# Patient Record
Sex: Male | Born: 1970 | Race: Black or African American | Hispanic: No | Marital: Single | Smoking: Never smoker
Health system: Southern US, Community
[De-identification: ages and names within clinical notes are randomized; demographics above are authoritative.]

## PROBLEM LIST (undated history)

## (undated) DIAGNOSIS — G5621 Lesion of ulnar nerve, right upper limb: Secondary | ICD-10-CM

## (undated) HISTORY — DX: Lesion of ulnar nerve, right upper limb: G56.21

---

## 2005-07-23 ENCOUNTER — Emergency Department (HOSPITAL_COMMUNITY): Admission: EM | Admit: 2005-07-23 | Discharge: 2005-07-23 | Payer: Self-pay | Admitting: Emergency Medicine

## 2006-02-13 ENCOUNTER — Emergency Department (HOSPITAL_COMMUNITY): Admission: EM | Admit: 2006-02-13 | Discharge: 2006-02-13 | Payer: Self-pay | Admitting: Family Medicine

## 2007-05-29 ENCOUNTER — Emergency Department (HOSPITAL_COMMUNITY): Admission: EM | Admit: 2007-05-29 | Discharge: 2007-05-29 | Payer: Self-pay | Admitting: Family Medicine

## 2008-09-03 IMAGING — CR DG FOOT COMPLETE 3+V*L*
3 series · 3 of 3 positions shown · non-contrast
Comparison: None.

CLINICAL DATA: Left foot pain.
 LEFT FOOT ? 3 VIEW:

[view not recorded (1 of 3)]
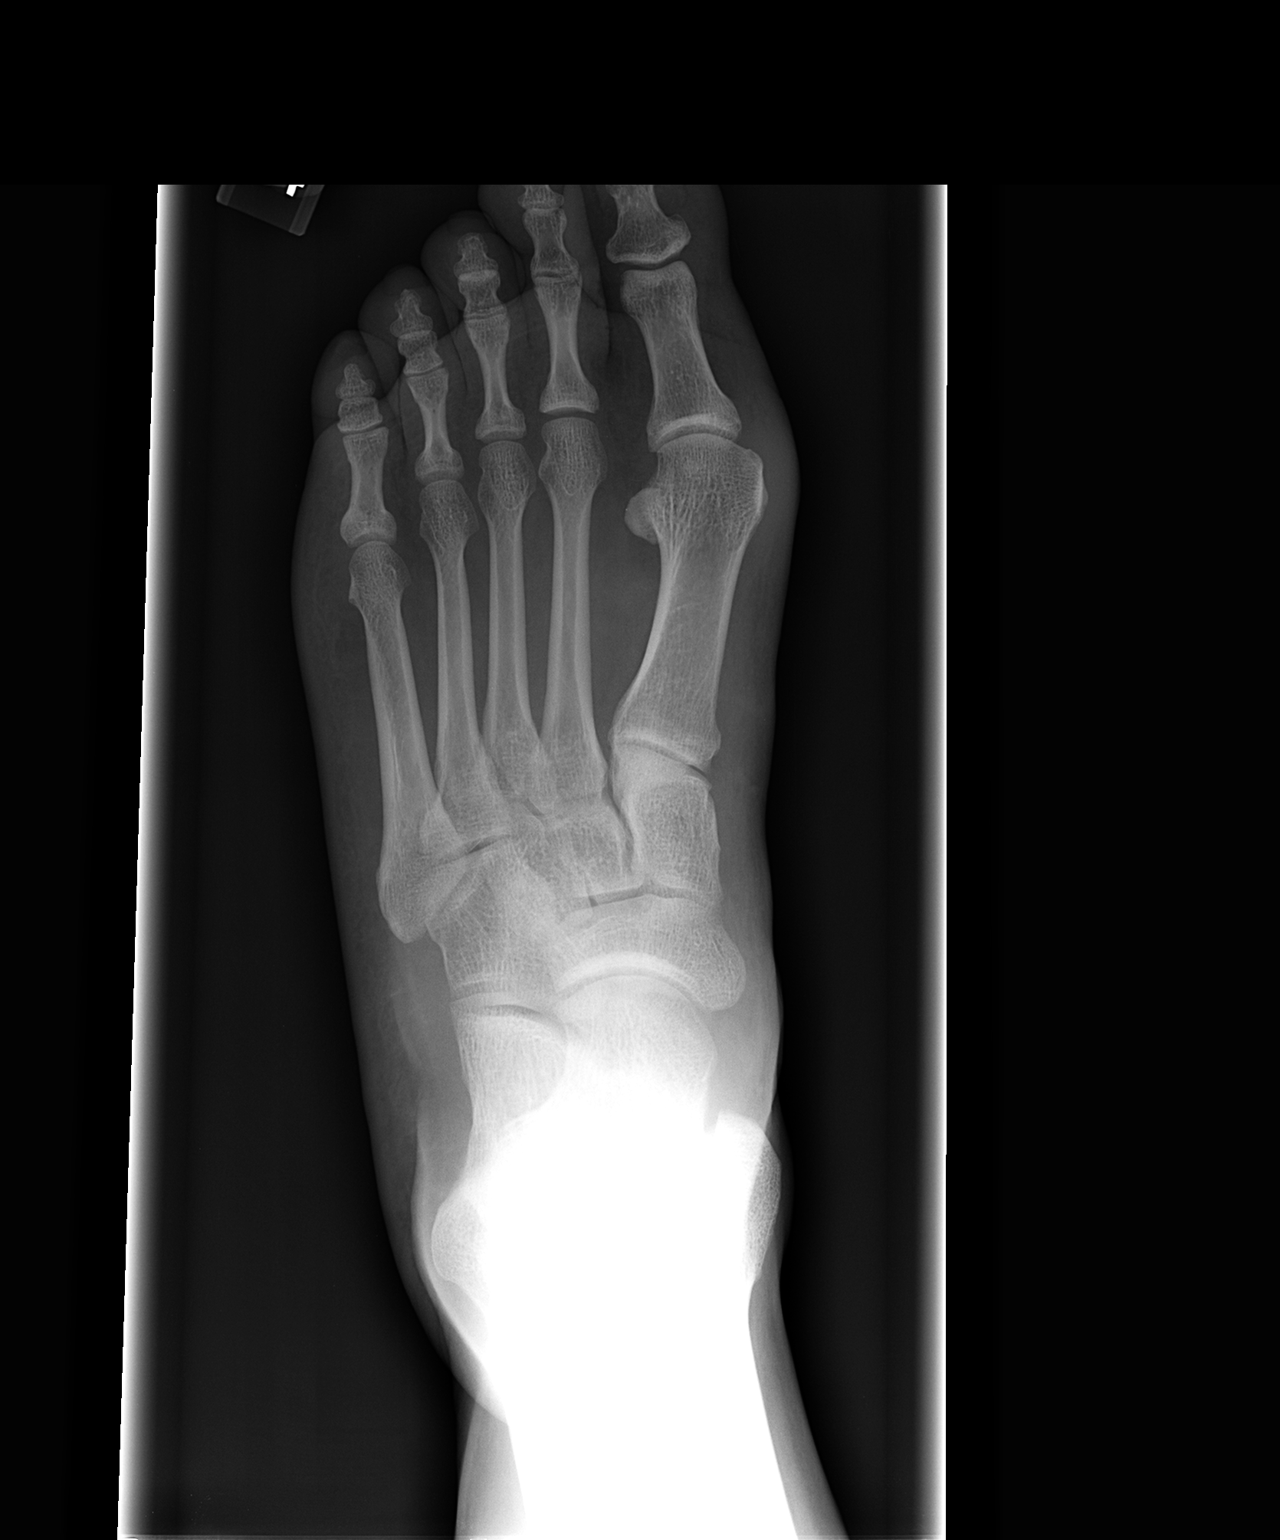

[view not recorded (2 of 3)]
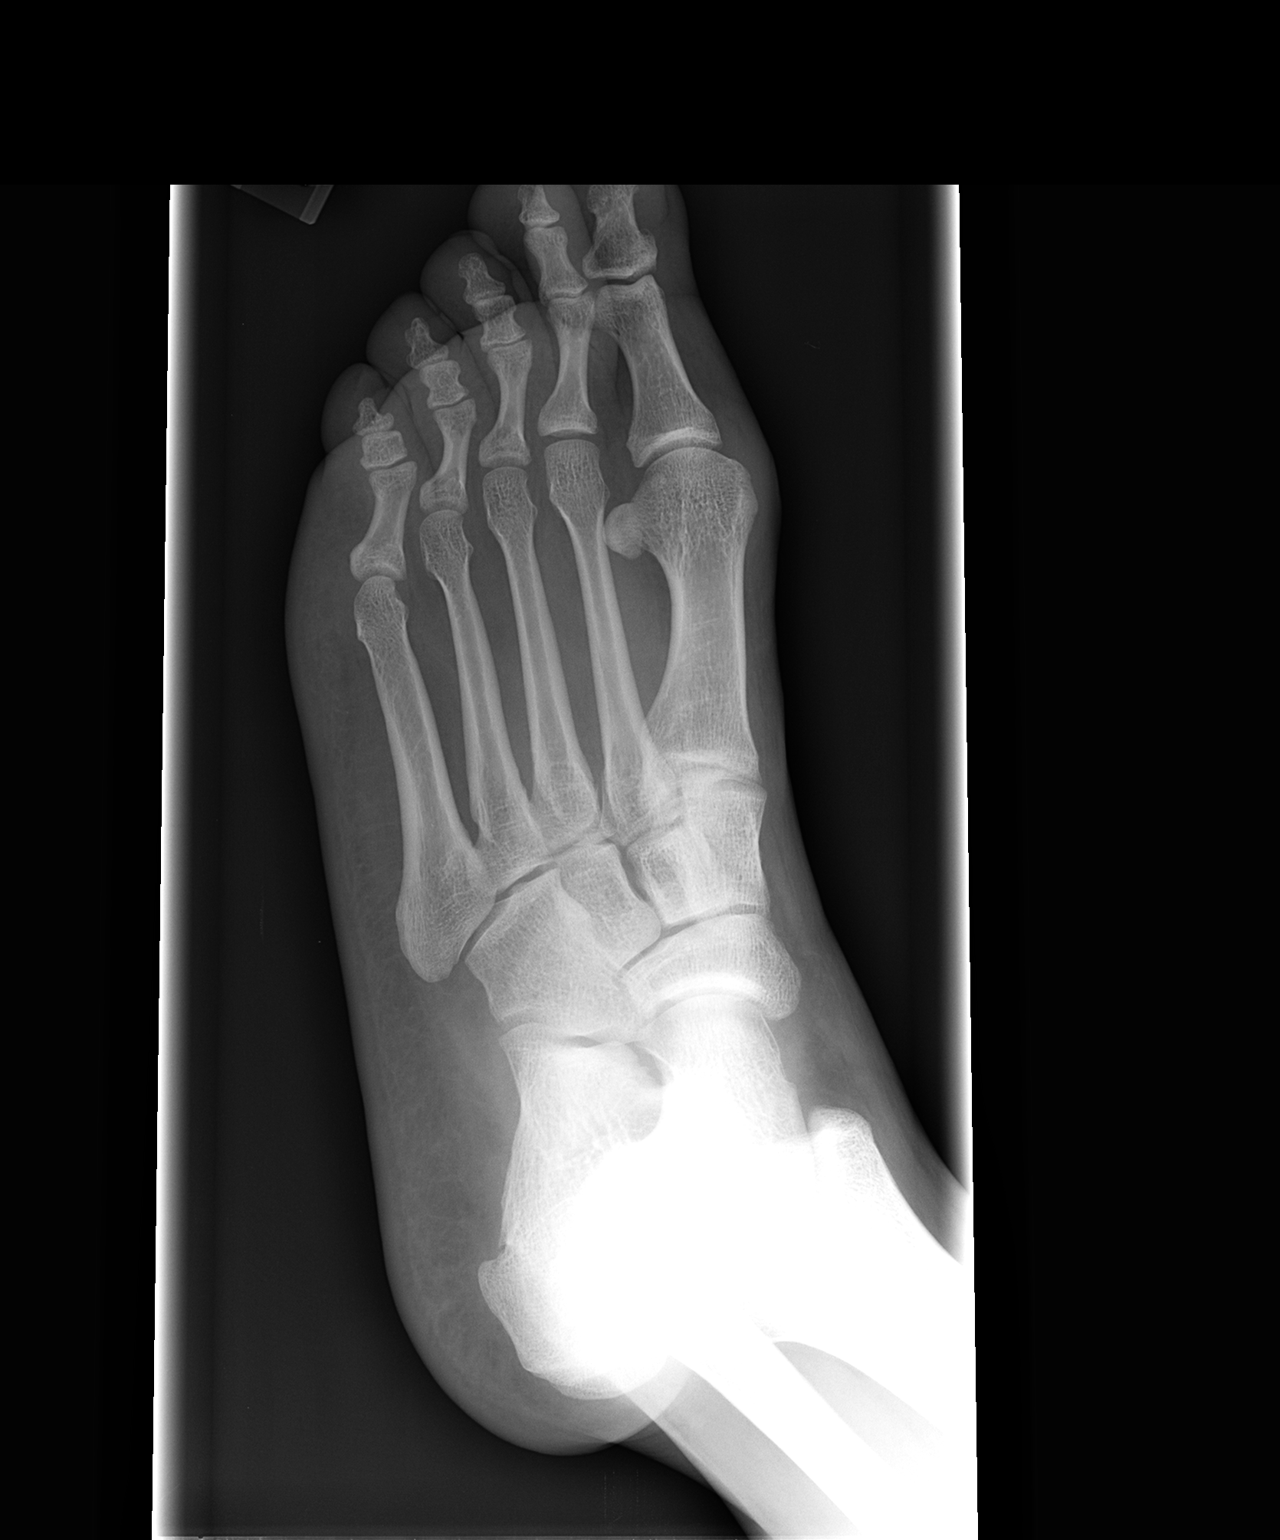

[view not recorded (3 of 3)]
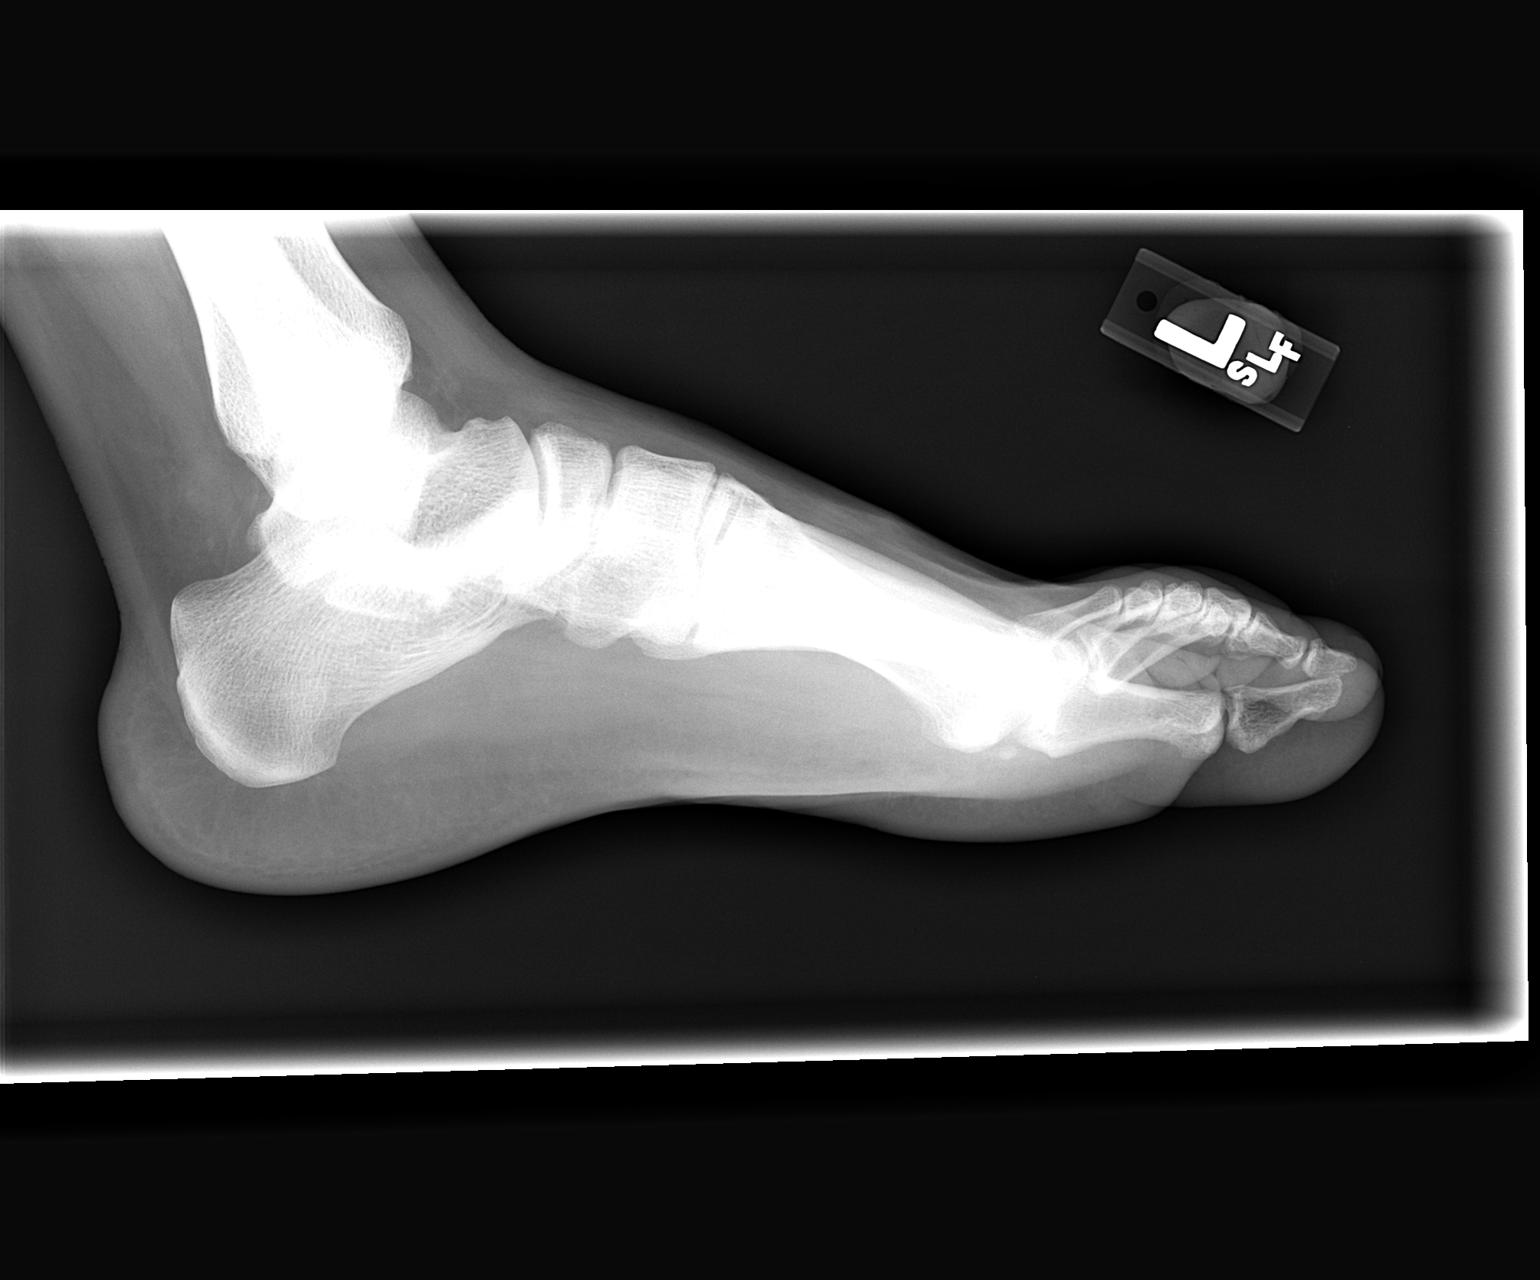

[3 of 3 positions shown; findings below may reference images not displayed]

FINDINGS: No fracture, dislocation or other bony abnormality is seen.  Soft tissues are unremarkable.
IMPRESSION: Normal left foot.

## 2014-11-24 ENCOUNTER — Encounter: Payer: Self-pay | Admitting: Neurology

## 2014-11-24 ENCOUNTER — Ambulatory Visit (INDEPENDENT_AMBULATORY_CARE_PROVIDER_SITE_OTHER): Payer: 59 | Admitting: Neurology

## 2014-11-24 ENCOUNTER — Ambulatory Visit (INDEPENDENT_AMBULATORY_CARE_PROVIDER_SITE_OTHER): Payer: Self-pay | Admitting: Neurology

## 2014-11-24 DIAGNOSIS — G5621 Lesion of ulnar nerve, right upper limb: Secondary | ICD-10-CM

## 2014-11-24 DIAGNOSIS — R202 Paresthesia of skin: Secondary | ICD-10-CM

## 2014-11-24 HISTORY — DX: Lesion of ulnar nerve, right upper limb: G56.21

## 2014-11-24 NOTE — Procedures (Signed)
     HISTORY:  Stanley Burch is a 44 year old gentleman with a one-month history of numbness that has affected primarily the ulnar aspect of the right hand. The patient indicates that the episode came on suddenly and has persisted. The patient denies any neck pain or pain down the right arm. The patient is being evaluated for a possible neuropathy or a cervical radiculopathy.  NERVE CONDUCTION STUDIES:  Nerve conduction studies were performed on the right upper extremity. The distal motor latencies and motor amplitudes for the median and ulnar nerves were within normal limits. The F wave latencies and nerve conduction velocities for these nerves were also normal. The sensory latencies for the median and ulnar nerves were normal.   EMG STUDIES:  EMG study was performed on the right upper extremity:  The first dorsal interosseous muscle reveals 2 to 4 K units with slightly decreased recruitment. 2+ fibrillations and positive waves were noted. The abductor pollicis brevis muscle reveals 2 to 5 K units with decreased recruitment. No fibrillations or positive waves were noted. The extensor indicis proprius muscle reveals 1 to 3 K units with full recruitment. No fibrillations or positive waves were noted. The pronator teres muscle reveals 2 to 3 K units with full recruitment. No fibrillations or positive waves were noted. The flexor digitorum profundus muscle (III-IV) reveals 2 to 4 K units with decreased recruitment. 2+ fibrillations and positive waves were seen. The biceps muscle reveals 1 to 2 K units with full recruitment. No fibrillations or positive waves were noted. The triceps muscle reveals 2 to 4 K units with full recruitment. No fibrillations or positive waves were noted. The anterior deltoid muscle reveals 2 to 3 K units with full recruitment. No fibrillations or positive waves were noted. The cervical paraspinal muscles were tested at 2 levels. No abnormalities of insertional activity  were seen at either level tested. There was poor relaxation.   IMPRESSION:  Nerve conduction studies done on the right upper extremity were unremarkable. No evidence of a neuropathy is seen. 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 No evidence of an overlying cervical radiculopathy is seen.  Stanley Burch. Stanley Ladawn Boullion MD 11/24/2014 11:08 AM  [facility] [address] [place], [place] [id number]  Phone [phone] Fax [phone]

## 2014-11-24 NOTE — Progress Notes (Signed)
Please refer to EMG and nerve conduction study procedure note. 

## 2016-09-17 DIAGNOSIS — M109 Gout, unspecified: Secondary | ICD-10-CM | POA: Diagnosis not present

## 2016-09-17 DIAGNOSIS — H109 Unspecified conjunctivitis: Secondary | ICD-10-CM | POA: Diagnosis not present

## 2016-09-26 DIAGNOSIS — E1165 Type 2 diabetes mellitus with hyperglycemia: Secondary | ICD-10-CM | POA: Diagnosis not present

## 2016-09-26 DIAGNOSIS — E782 Mixed hyperlipidemia: Secondary | ICD-10-CM | POA: Diagnosis not present

## 2016-10-02 DIAGNOSIS — M12842 Other specific arthropathies, not elsewhere classified, left hand: Secondary | ICD-10-CM | POA: Diagnosis not present

## 2016-10-02 DIAGNOSIS — M064 Inflammatory polyarthropathy: Secondary | ICD-10-CM | POA: Diagnosis not present

## 2016-10-08 DIAGNOSIS — M1A0790 Idiopathic chronic gout, unspecified ankle and foot, without tophus (tophi): Secondary | ICD-10-CM | POA: Diagnosis not present

## 2016-10-08 DIAGNOSIS — E782 Mixed hyperlipidemia: Secondary | ICD-10-CM | POA: Diagnosis not present

## 2016-10-08 DIAGNOSIS — E1165 Type 2 diabetes mellitus with hyperglycemia: Secondary | ICD-10-CM | POA: Diagnosis not present

## 2017-04-08 DIAGNOSIS — Z125 Encounter for screening for malignant neoplasm of prostate: Secondary | ICD-10-CM | POA: Diagnosis not present

## 2017-04-08 DIAGNOSIS — E1165 Type 2 diabetes mellitus with hyperglycemia: Secondary | ICD-10-CM | POA: Diagnosis not present

## 2017-04-08 DIAGNOSIS — I10 Essential (primary) hypertension: Secondary | ICD-10-CM | POA: Diagnosis not present

## 2017-04-15 DIAGNOSIS — E782 Mixed hyperlipidemia: Secondary | ICD-10-CM | POA: Diagnosis not present

## 2017-04-15 DIAGNOSIS — E1165 Type 2 diabetes mellitus with hyperglycemia: Secondary | ICD-10-CM | POA: Diagnosis not present

## 2017-04-15 DIAGNOSIS — Z23 Encounter for immunization: Secondary | ICD-10-CM | POA: Diagnosis not present

## 2017-04-15 DIAGNOSIS — Z0000 Encounter for general adult medical examination without abnormal findings: Secondary | ICD-10-CM | POA: Diagnosis not present

## 2017-08-18 DIAGNOSIS — J019 Acute sinusitis, unspecified: Secondary | ICD-10-CM | POA: Diagnosis not present

## 2017-08-27 DIAGNOSIS — E1165 Type 2 diabetes mellitus with hyperglycemia: Secondary | ICD-10-CM | POA: Diagnosis not present

## 2017-08-27 DIAGNOSIS — E782 Mixed hyperlipidemia: Secondary | ICD-10-CM | POA: Diagnosis not present

## 2017-09-15 DIAGNOSIS — E1165 Type 2 diabetes mellitus with hyperglycemia: Secondary | ICD-10-CM | POA: Diagnosis not present

## 2017-09-15 DIAGNOSIS — E782 Mixed hyperlipidemia: Secondary | ICD-10-CM | POA: Diagnosis not present

## 2017-09-15 DIAGNOSIS — M1A0790 Idiopathic chronic gout, unspecified ankle and foot, without tophus (tophi): Secondary | ICD-10-CM | POA: Diagnosis not present

## 2018-01-18 DIAGNOSIS — M10042 Idiopathic gout, left hand: Secondary | ICD-10-CM | POA: Diagnosis not present

## 2018-02-05 DIAGNOSIS — M1A0790 Idiopathic chronic gout, unspecified ankle and foot, without tophus (tophi): Secondary | ICD-10-CM | POA: Diagnosis not present

## 2018-02-05 DIAGNOSIS — E782 Mixed hyperlipidemia: Secondary | ICD-10-CM | POA: Diagnosis not present

## 2018-02-05 DIAGNOSIS — E1165 Type 2 diabetes mellitus with hyperglycemia: Secondary | ICD-10-CM | POA: Diagnosis not present

## 2018-02-22 DIAGNOSIS — E1165 Type 2 diabetes mellitus with hyperglycemia: Secondary | ICD-10-CM | POA: Diagnosis not present

## 2018-02-22 DIAGNOSIS — E782 Mixed hyperlipidemia: Secondary | ICD-10-CM | POA: Diagnosis not present

## 2018-02-22 DIAGNOSIS — I10 Essential (primary) hypertension: Secondary | ICD-10-CM | POA: Diagnosis not present

## 2018-06-15 DIAGNOSIS — E1165 Type 2 diabetes mellitus with hyperglycemia: Secondary | ICD-10-CM | POA: Diagnosis not present

## 2018-06-15 DIAGNOSIS — Z23 Encounter for immunization: Secondary | ICD-10-CM | POA: Diagnosis not present

## 2018-06-15 DIAGNOSIS — M1A09X0 Idiopathic chronic gout, multiple sites, without tophus (tophi): Secondary | ICD-10-CM | POA: Diagnosis not present

## 2018-06-21 DIAGNOSIS — Z0000 Encounter for general adult medical examination without abnormal findings: Secondary | ICD-10-CM | POA: Diagnosis not present

## 2018-06-21 DIAGNOSIS — E1165 Type 2 diabetes mellitus with hyperglycemia: Secondary | ICD-10-CM | POA: Diagnosis not present

## 2018-06-21 DIAGNOSIS — E782 Mixed hyperlipidemia: Secondary | ICD-10-CM | POA: Diagnosis not present

## 2018-06-21 DIAGNOSIS — I10 Essential (primary) hypertension: Secondary | ICD-10-CM | POA: Diagnosis not present

## 2018-09-28 DIAGNOSIS — I10 Essential (primary) hypertension: Secondary | ICD-10-CM | POA: Diagnosis not present

## 2018-09-28 DIAGNOSIS — E1165 Type 2 diabetes mellitus with hyperglycemia: Secondary | ICD-10-CM | POA: Diagnosis not present

## 2018-09-29 DIAGNOSIS — E1165 Type 2 diabetes mellitus with hyperglycemia: Secondary | ICD-10-CM | POA: Diagnosis not present

## 2018-09-29 DIAGNOSIS — E782 Mixed hyperlipidemia: Secondary | ICD-10-CM | POA: Diagnosis not present

## 2018-09-29 DIAGNOSIS — I10 Essential (primary) hypertension: Secondary | ICD-10-CM | POA: Diagnosis not present

## 2018-09-29 DIAGNOSIS — E291 Testicular hypofunction: Secondary | ICD-10-CM | POA: Diagnosis not present

## 2019-03-24 ENCOUNTER — Other Ambulatory Visit: Payer: Self-pay

## 2019-03-24 DIAGNOSIS — Z20822 Contact with and (suspected) exposure to covid-19: Secondary | ICD-10-CM

## 2019-03-25 LAB — NOVEL CORONAVIRUS, NAA: SARS-CoV-2, NAA: NOT DETECTED

## 2019-05-18 ENCOUNTER — Other Ambulatory Visit: Payer: Self-pay

## 2019-05-18 DIAGNOSIS — Z20822 Contact with and (suspected) exposure to covid-19: Secondary | ICD-10-CM

## 2019-05-19 ENCOUNTER — Telehealth: Payer: Self-pay

## 2019-05-19 LAB — NOVEL CORONAVIRUS, NAA: SARS-CoV-2, NAA: NOT DETECTED

## 2019-05-19 NOTE — Telephone Encounter (Signed)
Patient called in requesting COVID19 lab results and MyChart password reset - DOB/Address verified - Negative results given, reset MyChart password, no further questions. 

## 2021-07-24 ENCOUNTER — Other Ambulatory Visit: Payer: Self-pay | Admitting: Orthopedic Surgery

## 2021-07-24 ENCOUNTER — Other Ambulatory Visit (HOSPITAL_COMMUNITY): Payer: Self-pay | Admitting: Orthopedic Surgery

## 2021-07-24 DIAGNOSIS — M545 Low back pain, unspecified: Secondary | ICD-10-CM

## 2021-08-02 ENCOUNTER — Encounter (HOSPITAL_COMMUNITY): Payer: Self-pay

## 2021-08-02 ENCOUNTER — Ambulatory Visit (HOSPITAL_COMMUNITY): Payer: 59

## 2023-12-01 ENCOUNTER — Ambulatory Visit: Admitting: Podiatry

## 2023-12-17 ENCOUNTER — Encounter: Payer: Self-pay | Admitting: Podiatry

## 2023-12-17 ENCOUNTER — Ambulatory Visit: Admitting: Podiatry

## 2023-12-17 DIAGNOSIS — M722 Plantar fascial fibromatosis: Secondary | ICD-10-CM

## 2023-12-17 MED ORDER — TRIAMCINOLONE ACETONIDE 10 MG/ML IJ SUSP
10.0000 mg | Freq: Once | INTRAMUSCULAR | Status: AC
  Administered 2023-12-17: 10 mg

## 2023-12-17 NOTE — Progress Notes (Signed)
 Patient presents with complaint of pain in the plantar and posterior aspect of the heel right.  Does not anymore for the past several weeks.  Does not recall any specific injury or trauma to it.  Has not noticed any redness or swelling.   Physical exam:  General appearance: Pleasant, and in no acute distress. AOx3.  Vascular: Pedal pulses: DP 2/4, PT 2/4.  No edema lower legs bilaterally. Capillary fill time immediate.  Neurological: Light touch intact feet bilaterally.  Normal Achilles reflex bilaterally.  No clonus or spasticity noted.  Negative Tinel's sign tarsal tunnel, porta pedis, sural nerve right.  Dermatologic:   Skin normal temperature bilaterally.  Skin normal color, tone, and texture bilaterally.   Musculoskeletal: Tenderness at plantar aspect calcaneus at the medial calcaneal tubercle and of the plantar aspect of the heel.  Tenderness at the posterior aspect of the heel and the distal 2 to 3 cm of the Achilles tendon right.  No tenderness with lateral compression of calcaneus.  Radiographs: None today  Diagnosis: 1.  Plantar fasciitis right.  Plan: -Discussed in the plantar fasciitis and Achilles tendinitis on the right foot.  Plantar fasciitis is worse today and this might be aggravating the chronic Achilles tendinitis.  Recommended RICE -injected 3cc 2:1 mixture 0.5 cc Marcaine:Kenolog 10mg /52ml at the origin plantar fascia of right.    Return 1 week follow-up injection right.

## 2023-12-31 ENCOUNTER — Ambulatory Visit: Admitting: Podiatry

## 2024-01-07 ENCOUNTER — Ambulatory Visit: Admitting: Podiatry

## 2024-01-07 ENCOUNTER — Encounter: Payer: Self-pay | Admitting: Podiatry

## 2024-01-07 DIAGNOSIS — M722 Plantar fascial fibromatosis: Secondary | ICD-10-CM

## 2024-01-07 MED ORDER — TRAMADOL HCL 50 MG PO TABS: 50.0000 mg | ORAL_TABLET | Freq: Three times a day (TID) | ORAL | 0 refills | Status: DC | PRN

## 2024-01-07 MED ORDER — TRAMADOL HCL 50 MG PO TABS: 50.0000 mg | ORAL_TABLET | Freq: Three times a day (TID) | ORAL | 0 refills | Status: AC | PRN

## 2024-01-07 MED ORDER — TRIAMCINOLONE ACETONIDE 10 MG/ML IJ SUSP
10.0000 mg | Freq: Once | INTRAMUSCULAR | Status: AC
  Administered 2024-01-07: 10 mg

## 2024-01-07 NOTE — Addendum Note (Signed)
 Addended by: CHRISTINE NORLEEN LABOR on: 01/07/2024 05:31 PM   Modules accepted: Orders

## 2024-01-07 NOTE — Addendum Note (Signed)
 Addended by: GERRIT ANDREZ CROME on: 01/07/2024 05:00 PM   Modules accepted: Orders

## 2024-01-07 NOTE — Addendum Note (Signed)
 Addended by: GERRIT ANDREZ CROME on: 01/07/2024 04:50 PM   Modules accepted: Orders

## 2024-01-07 NOTE — Addendum Note (Signed)
 Addended by: GERRIT ANDREZ CROME on: 01/07/2024 05:19 PM   Modules accepted: Orders

## 2024-01-07 NOTE — Progress Notes (Signed)
 Presents follow-up plantar fasciitis doing better about 50% still some soreness.   Physical exam:  General appearance: Pleasant, and in no acute distress. AOx3.  Vascular: Pedal pulses: DP 2/4 bilaterally, PT 2/4 bilaterally. mild edema lower legs bilaterally. Capillary fill time immediate.  Neurological: Light touch intact feet bilaterally.  Normal Achilles reflex bilaterally.    Dermatologic:   Skin normal temperature bilaterally.  Skin normal color, tone, and texture bilaterally.   Musculoskeletal: Plantar medial aspect heel right at the plantar calcaneal tubercle.  No tenderness with lateral compression of the calcaneus   Diagnosis: 1.  Plantar fasciitis right  Plan: -Some improvement in the plantar fasciitis but.  Will try another injection today. - Continue ice and Voltaren gel as needed Rx naproxen 500 mg, 1 p.o. twice daily, 2 refills -injected 3cc 2:1 mixture 0.5 cc Marcaine:Kenolog 10mg /20ml at origin medial calcaneus plantar fascia right.    Return 2 weeks follow-up plantar fasciitis right

## 2024-01-21 ENCOUNTER — Ambulatory Visit: Admitting: Podiatry

## 2024-02-11 ENCOUNTER — Ambulatory Visit: Admitting: Podiatry

## 2024-03-09 ENCOUNTER — Ambulatory Visit: Admitting: Podiatry

## 2024-03-09 ENCOUNTER — Encounter: Payer: Self-pay | Admitting: Podiatry

## 2024-03-09 DIAGNOSIS — M722 Plantar fascial fibromatosis: Secondary | ICD-10-CM | POA: Diagnosis not present

## 2024-03-09 MED ORDER — TRIAMCINOLONE ACETONIDE 10 MG/ML IJ SUSP
10.0000 mg | Freq: Once | INTRAMUSCULAR | Status: AC
  Administered 2024-03-09: 10 mg

## 2024-03-09 NOTE — Progress Notes (Signed)
 Presents complaining of increased pain over the past couple weeks at the heel on the right.  We have not seen him in 2 months he had been doing better.  Also has started getting pain in the posterior aspect of the heel and along the Achilles tendon right.  Does not recall any injury to it.  Has not noticed any redness or ecchymosis.   Physical exam:  General appearance: Pleasant, and in no acute distress. AOx3.  Vascular: Pedal pulses: DP 2/4 bilaterally, PT 2/4 bilaterally. No edema lower legs bilaterally. Capillary fill time immediate B/L.  Neurological: Negative Tinel's sign tarsal tunnel and porta pedis right   Dermatologic:   Skin normal temperature bilaterally.  Skin normal color, tone, and texture bilaterally.   Musculoskeletal: Tenderness plantar medial heel right.  Tenderness of the medial calcaneal tubercle.  No tenderness over the lateral compression of the calcaneus.  Some tenderness in the posterior aspect of the heel.   Diagnosis: 1.  Plantar fasciitis right  Plan: -Recommend stretching exercises and muscle strengthening exercises for the right. -injected 3cc 2:1 mixture 0.5 cc Marcaine:Kenolog 10mg /55ml at medial plantar fascial origin at the medial plantar calcaneal tubercle right.    Return 2 weeks follow-up injection plantar fascia right

## 2024-03-10 ENCOUNTER — Telehealth: Payer: Self-pay | Admitting: Podiatry

## 2024-03-10 NOTE — Telephone Encounter (Signed)
 He was seen 03/09/2024.

## 2024-03-10 NOTE — Telephone Encounter (Signed)
 Pt. Needs medication refill for Tramadol  50 mg

## 2024-03-10 NOTE — Telephone Encounter (Signed)
 He was just seen in our office.

## 2024-03-17 ENCOUNTER — Other Ambulatory Visit: Payer: Self-pay | Admitting: Podiatry

## 2024-03-17 ENCOUNTER — Telehealth: Payer: Self-pay | Admitting: Lab

## 2024-03-17 DIAGNOSIS — M79673 Pain in unspecified foot: Secondary | ICD-10-CM

## 2024-03-17 MED ORDER — TRAMADOL HCL 50 MG PO TABS: 50.0000 mg | ORAL_TABLET | Freq: Three times a day (TID) | ORAL | 0 refills | Status: AC | PRN

## 2024-03-17 NOTE — Telephone Encounter (Signed)
 Patient is requesting pain medication called in for him he states he called last week and has not gotten a response.

## 2024-03-17 NOTE — Telephone Encounter (Signed)
 Patient states he has been an ongoing patient with you but the pain medication was given in this office has had a few visits in regards to his foot pain that you have been treating him for also he wants to remind you of discussed procedure at his last visit wants to know if you have looked into it.

## 2024-03-17 NOTE — Telephone Encounter (Signed)
Thanks Carolee Rota!!

## 2024-03-17 NOTE — Telephone Encounter (Signed)
 Patient states you prescribed his Tramadol .

## 2024-03-23 ENCOUNTER — Ambulatory Visit: Admitting: Podiatry

## 2024-03-23 DIAGNOSIS — M722 Plantar fascial fibromatosis: Secondary | ICD-10-CM

## 2024-03-23 MED ORDER — TRIAMCINOLONE ACETONIDE 10 MG/ML IJ SUSP
10.0000 mg | Freq: Once | INTRAMUSCULAR | Status: AC
  Administered 2024-03-23: 10 mg

## 2024-03-23 NOTE — Progress Notes (Signed)
 Presents today follow-up injection around plantar fascia right.  About 50% better compared to last visit.  Still sore.   Physical exam:  General appearance: Pleasant, and in no acute distress. AOx3.  Vascular: Pedal pulses: DP 2/4 bilaterally, PT 2/4 bilaterally. No edema lower legs bilaterally. Capillary fill time immediate b/l.  Neurological: Negative Tinel sign tarsal tunnel and porta pedis bilaterally  Dermatologic:   Skin normal temperature bilaterally.  Skin normal color, tone, and texture bilaterally.   Musculoskeletal: Tenderness plantar medial aspect right heel at medial calcaneal tubercle.  No tenderness with lateral compression of the calcaneus.  No tenderness on posterior heel today.   Diagnosis: 1.  Plantar fasciitis right.  Plan: -Continue exercises and wearing beast shoes.  Discussed them again considering a PRP. -injected 3cc 2:1 mixture 0.5 cc Marcaine:Kenolog 10mg /77ml at medial plantar origin of plantar fascia at medial calcaneal tubercle right.     Return 2 weeks follow-up injection plantar fascia right

## 2024-04-07 ENCOUNTER — Ambulatory Visit: Admitting: Podiatry

## 2024-04-08 ENCOUNTER — Telehealth: Payer: Self-pay | Admitting: Lab

## 2024-04-08 ENCOUNTER — Other Ambulatory Visit: Payer: Self-pay | Admitting: Lab

## 2024-04-08 NOTE — Telephone Encounter (Signed)
 Patient is calling for refill on his Tramadol  50 mg down to his last one.

## 2024-04-12 ENCOUNTER — Other Ambulatory Visit: Payer: Self-pay | Admitting: Podiatry

## 2024-04-12 MED ORDER — TRAMADOL HCL 50 MG PO TABS: 50.0000 mg | ORAL_TABLET | Freq: Three times a day (TID) | ORAL | 0 refills | Status: AC | PRN

## 2024-04-21 ENCOUNTER — Ambulatory Visit: Admitting: Podiatry

## 2024-04-21 ENCOUNTER — Encounter: Payer: Self-pay | Admitting: Podiatry

## 2024-04-21 DIAGNOSIS — M722 Plantar fascial fibromatosis: Secondary | ICD-10-CM

## 2024-04-21 MED ORDER — TRAMADOL HCL 50 MG PO TABS: 50.0000 mg | ORAL_TABLET | Freq: Three times a day (TID) | ORAL | 0 refills | Status: AC | PRN

## 2024-04-21 NOTE — Progress Notes (Signed)
 Patient to follow-up plantar fasciitis right foot.  No improvement after last injection.  Continues to be painful.  Says pain at the plantar medial aspect of the heel.   Physical exam:  General appearance: Pleasant, and in no acute distress. AOx3.  Vascular: Pedal pulses: DP 2/4 bilaterally, PT 2/4 bilaterally.  Minimal edema lower legs bilaterally. Capillary fill time immediate bilaterally.  Neurological: Light touch intact feet bilaterally.  Normal Achilles reflex bilaterally.  No clonus or spasticity noted.  Negative Tinel's sign tarsal tunnel and porta pedis bilaterally  Dermatologic:   Skin normal temperature bilaterally.  Skin normal color, tone, and texture bilaterally.   Musculoskeletal: Passive plantar medial heel at the heel at medial plantar calcaneal tubercle right.  No tenderness lateral compression of calcaneus.    Diagnosis: 1.  Plantar fasciitis right.-Still symptomatic minimal improvement  Plan: -Established office visit for evaluation and management.  Level 3.   -Continues to have pain at the plantar fascia right.  We have exhausted conservative options including PT injections prednisone exercises and changes in shoes.  At this point would recommend either platelet or platelet rich plasma injections or surgery.  Discussed pros and cons of both with him.  Patient would like to pursue PRP treatment   Return next available PRP appointment for injection at plantar fasciitis at heel right

## 2024-04-21 NOTE — Addendum Note (Signed)
 Addended by: CHRISTINE NORLEEN LABOR on: 04/21/2024 03:37 PM   Modules accepted: Orders

## 2024-05-03 ENCOUNTER — Ambulatory Visit (INDEPENDENT_AMBULATORY_CARE_PROVIDER_SITE_OTHER): Admitting: Podiatry

## 2024-05-03 DIAGNOSIS — M722 Plantar fascial fibromatosis: Secondary | ICD-10-CM

## 2024-05-03 NOTE — Progress Notes (Signed)
 Patient presented today for PRP injection number one of his right plantar fascia.  Discussion of the risks and benefits of the procedure were had prior to the procedure.  Following consent a limited ultrasound musculoskeletal examination of the right plantar fascia was completed in the prone position.  The exam showed no bursal formation, no major heel spurring. Images show thickening of the plantar fascia approximately 4.5 mm in thickness of the medial band.  The exam was completed and the gel was cleansed from the foot.  15 cc of whole blood were harvested by our nurse from the antecubital fossa.  This yielded 6 cc of platelet plasma after centrifugation.  It was prepped with CHG and sterile surgical lubricant applied to the site.  The site of thickening was revisualized.  Direct biplanar ultrasound guidance was then used to locate the needle in the plantar fascia and good location was confirmed.  6 cc of platelet rich plasma was injected into and along the medial band and central band of plantar fascia which was well-tolerated.  It was dressed with Band-Aid and post care instructions given.  Home physical therapy plan dispensed and he will do this twice daily.  Follow-up in 6 weeks to reevaluate or sooner if problems.  Juliene Medicine, DPM 05/03/2024

## 2024-05-03 NOTE — Patient Instructions (Signed)

## 2024-06-16 ENCOUNTER — Ambulatory Visit: Admitting: Podiatry

## 2024-06-27 ENCOUNTER — Ambulatory Visit: Payer: Self-pay | Admitting: Surgery

## 2024-06-27 NOTE — H&P (Signed)
 Subjective   Chief Complaint: New Consultation (NEW PATIENT - Left Inguinal Hernia)     History of Present Illness: Stanley Burch is a 53 y.o. male who is seen today as an office consultation at the request of Dr. Verdia for evaluation of New Consultation (NEW PATIENT - Left Inguinal Hernia) .    This is a 53 year old male who works in the [employer] for the city of [place].  He presents with an 81-month history of a small bulge in his left groin.  Recently, this has become larger and more uncomfortable.  Remains reducible.  He was diagnosed with a left inguinal hernia.  His primary care doctor referred him to us  for evaluation for hernia repair.  He continues to have regular bowel movements.  He does have frequency of urination.  He is on Ozempic.  Previous open appendectomy but no groin surgery.   Review of Systems: A complete review of systems was obtained from the patient.  I have reviewed this information and discussed as appropriate with the patient.  See HPI as well for other ROS.  Review of Systems  Constitutional: Negative.   HENT: Negative.    Eyes: Negative.   Respiratory: Negative.    Cardiovascular: Negative.   Gastrointestinal:  Positive for heartburn.  Genitourinary: Negative.   Musculoskeletal: Negative.   Skin: Negative.   Neurological: Negative.   Endo/Heme/Allergies: Negative.   Psychiatric/Behavioral: Negative.        Medical History: Past Medical History:  Diagnosis Date   Ulnar neuropathy at elbow of right upper extremity     Patient Active Problem List  Diagnosis   Degeneration of lumbar intervertebral disc   Lumbar radiculopathy    History reviewed. No pertinent surgical history.   No Known Allergies  Current Outpatient Medications on File Prior to Visit  Medication Sig Dispense Refill   ibuprofen (MOTRIN) 600 MG tablet TAKE 1 TABLET BY MOUTH THREE TIMES A DAY WITH FOOD OR MILK AS NEEDED FOR 30 DAYS     lisinopriL (ZESTRIL)  40 MG tablet TAKE 1 TABLET BY MOUTH ONCE DAILY 30 90 DAYS     OZEMPIC 0.25 mg or 0.5 mg (2 mg/3 mL) pen injector INJECT 0.5MG  ONCE WEEKLY SUBCUTANEOUS     allopurinoL (ZYLOPRIM) 300 MG tablet Take 300 mg by mouth once daily (Patient not taking: Reported on 06/27/2024)     atorvastatin (LIPITOR) 40 MG tablet Take 40 mg by mouth once daily (Patient not taking: Reported on 06/27/2024)     colchicine 0.6 mg Cap TAKE 1 CAPSULE BY MOUTH EVERY DAY 30 DAYS (Patient not taking: Reported on 06/27/2024)     sAXagliptin-metFORMIN (KOMBIGLYZE XR) 2.5-1,000 mg ER 24 hr multiphase tablet 1 TABLET WITH MEALS TWICE A DAY ORALLY (Patient not taking: Reported on 06/27/2024)     No current facility-administered medications on file prior to visit.    History reviewed. No pertinent family history.   Social History   Tobacco Use  Smoking Status Never  Smokeless Tobacco Never     Social History   Socioeconomic History   Marital status: Single  Tobacco Use   Smoking status: Never   Smokeless tobacco: Never  Substance and Sexual Activity   Alcohol use: Yes    Alcohol/week: 12.0 - 28.0 standard drinks of alcohol    Types: 12 - 28 Standard drinks or equivalent per week   Drug use: Never   Social Drivers of Health   Housing Stability: Unknown (06/27/2024)   Housing Stability Vital Sign  Homeless in the Last Year: No    Objective:    Vitals:   06/27/24 1006  BP: 129/81  Pulse: 83  Temp: 37.9 C (100.2 F)  TempSrc: Temporal  SpO2: 99%  Weight: 69.4 kg (153 lb)  Height: 167.6 cm (5' 6)  PainSc: 0-No pain    Body mass index is 24.69 kg/m.  Physical Exam   Constitutional:  WDWN in NAD, conversant, no obvious deformities; lying in bed comfortably Eyes:  Pupils equal, round; sclera anicteric; moist conjunctiva; no lid lag HENT:  Oral mucosa moist; good dentition  Neck:  No masses palpated, trachea midline; no thyromegaly Lungs:  CTA bilaterally; normal respiratory effort CV:  Regular  rate and rhythm; no murmurs; extremities well-perfused with no edema Abd:  +bowel sounds, soft, non-tender, no palpable organomegaly; no palpable hernias GU: Bilateral descended testes, no testicular masses, no sign of right inguinal hernia.  Visible palpable reducible left inguinal hernia Musc: Normal gait; no apparent clubbing or cyanosis in extremities Lymphatic:  No palpable cervical or axillary lymphadenopathy Skin:  Warm, dry; no sign of jaundice Psychiatric - alert and oriented x 4; calm mood and affect    Assessment and Plan:  Diagnoses and all orders for this visit:  Left inguinal hernia     Recommend left inguinal hernia repair with mesh.The surgical procedure has been discussed with the patient.  Potential risks, benefits, alternative treatments, and expected outcomes have been explained.  All of the patient's questions at this time have been answered.  The likelihood of reaching the patient's treatment goal is good.  The patient understands the proposed surgical procedure and wishes to proceed.   Kaizlee Carlino DEWAYNE LIMA, MD  06/27/2024 10:32 AM
